# Patient Record
Sex: Male | Born: 1965 | Race: White | Hispanic: No | Marital: Single | Smoking: Current every day smoker
Health system: Southern US, Community
[De-identification: ages and names within clinical notes are randomized; demographics above are authoritative.]

## PROBLEM LIST (undated history)

## (undated) DIAGNOSIS — R42 Dizziness and giddiness: Secondary | ICD-10-CM

## (undated) DIAGNOSIS — R55 Syncope and collapse: Secondary | ICD-10-CM

## (undated) DIAGNOSIS — R0602 Shortness of breath: Secondary | ICD-10-CM

## (undated) HISTORY — DX: Shortness of breath: R06.02

## (undated) HISTORY — DX: Syncope and collapse: R55

## (undated) HISTORY — PX: HERNIA REPAIR: SHX51

## (undated) HISTORY — DX: Dizziness and giddiness: R42

## (undated) HISTORY — PX: KNEE SURGERY: SHX244

---

## 2018-03-28 ENCOUNTER — Emergency Department (HOSPITAL_COMMUNITY)
Admission: EM | Admit: 2018-03-28 | Discharge: 2018-03-28 | Disposition: A | Discharge status: Home / Self Care | Payer: Self-pay | Attending: Emergency Medicine | Admitting: Emergency Medicine

## 2018-03-28 ENCOUNTER — Other Ambulatory Visit: Payer: Self-pay

## 2018-03-28 ENCOUNTER — Encounter (HOSPITAL_COMMUNITY): Payer: Self-pay | Admitting: Emergency Medicine

## 2018-03-28 ENCOUNTER — Emergency Department (HOSPITAL_COMMUNITY): Payer: Self-pay

## 2018-03-28 DIAGNOSIS — R42 Dizziness and giddiness: Secondary | ICD-10-CM | POA: Insufficient documentation

## 2018-03-28 DIAGNOSIS — Z79899 Other long term (current) drug therapy: Secondary | ICD-10-CM | POA: Insufficient documentation

## 2018-03-28 LAB — BASIC METABOLIC PANEL
Anion gap: 11 (ref 5–15)
BUN: 14 mg/dL (ref 6–20)
CALCIUM: 9.9 mg/dL (ref 8.9–10.3)
CO2: 25 mmol/L (ref 22–32)
Chloride: 101 mmol/L (ref 98–111)
Creatinine, Ser: 0.83 mg/dL (ref 0.61–1.24)
GFR calc Af Amer: >60 mL/min (ref >60)
Glucose, Bld: 97 mg/dL (ref 70–99)
Potassium: 3.9 mmol/L (ref 3.5–5.1)
Sodium: 137 mmol/L (ref 135–145)

## 2018-03-28 LAB — CBC
HCT: 52.6 % — ABNORMAL HIGH (ref 39.0–52.0)
Hemoglobin: 17 g/dL (ref 13.0–17.0)
MCH: 31.8 pg (ref 26.0–34.0)
MCHC: 32.3 g/dL (ref 30.0–36.0)
MCV: 98.5 fL (ref 78.0–100.0)
Platelets: 138 10*3/uL — ABNORMAL LOW (ref 150–400)
RBC: 5.34 MIL/uL (ref 4.22–5.81)
RDW: 12.6 % (ref 11.5–15.5)
WBC: 8.5 10*3/uL (ref 4.0–10.5)

## 2018-03-28 LAB — I-STAT TROPONIN, ED: TROPONIN I, POC: 0 ng/mL (ref 0.00–0.08)

## 2018-03-28 MED ORDER — MECLIZINE HCL 25 MG PO TABS: 25.0000 mg | ORAL_TABLET | Freq: Three times a day (TID) | ORAL | 0 refills | Status: AC | PRN

## 2018-03-28 MED ORDER — MECLIZINE HCL 25 MG PO TABS
25.0000 mg | ORAL_TABLET | Freq: Once | ORAL | Status: AC
  Administered 2018-03-28: 25 mg via ORAL
  Filled 2018-03-28: qty 1

## 2018-03-28 NOTE — ED Provider Notes (Signed)
[facility] EMERGENCY DEPARTMENT Provider Note   CSN: [id number] Arrival date & time: 03/28/18  1506     History   Chief Complaint Chief Complaint  Patient presents with  . Dizziness    HPI David Bullock is a 52 y.o. male.  The history is provided by the patient.  Near Syncope  This is a chronic problem. The current episode started more than 1 week ago. The problem occurs every several days. The problem has not changed since onset.Pertinent negatives include no chest pain, no abdominal pain, no headaches and no shortness of breath. Exacerbated by: comes on every once in a while, no known trigger. Nothing relieves the symptoms. He has tried nothing for the symptoms. The treatment provided no relief.    History reviewed. No pertinent past medical history.  There are no active problems to display for this patient.   History reviewed. No pertinent surgical history.      Home Medications    Prior to Admission medications   Medication Sig Start Date End Date Taking? Authorizing Provider  acetaminophen (TYLENOL) 500 MG tablet Take 1,000 mg by mouth every 6 (six) hours as needed for mild pain, moderate pain, fever or headache.   Yes [provider]  meclizine (ANTIVERT) 25 MG tablet Take 1 tablet (25 mg total) by mouth 3 (three) times daily as needed for up to 20 doses for dizziness. 03/28/18   Virgina Norfolk, DO    Family History No family history on file.  Social History Social History   Tobacco Use  . Smoking status: Not on file  Substance Use Topics  . Alcohol use: Not on file  . Drug use: Not on file   Patient does not smoke, does not use alcohol, does not use drugs  Allergies   Aspirin and Ibuprofen   Review of Systems Review of Systems  Constitutional: Negative for chills and fever.  HENT: Negative for ear pain and sore throat.   Eyes: Negative for pain and visual disturbance.  Respiratory: Negative for cough and shortness  of breath.   Cardiovascular: Positive for near-syncope. Negative for chest pain and palpitations.  Gastrointestinal: Positive for nausea. Negative for abdominal pain and vomiting.  Genitourinary: Negative for dysuria and hematuria.  Musculoskeletal: Negative for arthralgias and back pain.  Skin: Negative for color change and rash.  Neurological: Positive for dizziness. Negative for seizures, syncope and headaches.  All other systems reviewed and are negative.    Physical Exam Updated Vital Signs  ED Triage Vitals  Enc Vitals Group     BP 03/28/18 1538 113/75     Pulse Rate 03/28/18 1538 74     Resp 03/28/18 1538 17     Temp 03/28/18 1538 98.5 F (36.9 C)     Temp src --      SpO2 03/28/18 1538 98 %     Weight 03/28/18 1541 187 lb (84.8 kg)     Height 03/28/18 1541 5\' 9"  (1.753 m)     Head Circumference --      Peak Flow --      Pain Score 03/28/18 1541 3     Pain Loc --      Pain Edu? --      Excl. in GC? --     Physical Exam  Constitutional: He is oriented to person, place, and time. He appears well-developed and well-nourished.  HENT:  Head: Normocephalic and atraumatic.  Mouth/Throat: No oropharyngeal exudate.  Eyes: Pupils are equal, round,  and reactive to light. Conjunctivae and EOM are normal.  Neck: Normal range of motion. Neck supple.  Cardiovascular: Normal rate, regular rhythm, normal heart sounds and intact distal pulses.  No murmur heard. Pulmonary/Chest: Effort normal and breath sounds normal. No stridor. No respiratory distress.  Abdominal: Soft. Bowel sounds are normal. There is no tenderness.  Musculoskeletal: Normal range of motion. He exhibits no edema.  Neurological: He is alert and oriented to person, place, and time. No cranial nerve deficit or sensory deficit. He exhibits normal muscle tone. Coordination normal.  5+/5 strength, normal sensation, no drift, normal finger to nose finger  Skin: Skin is warm and dry.  Psychiatric: He has a normal mood  and affect.  Nursing note and vitals reviewed.    ED Treatments / Results  Labs (all labs ordered are listed, but only abnormal results are displayed) Labs Reviewed  CBC - Abnormal; Notable for the following components:      Result Value   HCT 52.6 (*)    Platelets 138 (*)    All other components within normal limits  BASIC METABOLIC PANEL  I-STAT TROPONIN, ED    EKG EKG Interpretation  Date/Time:  Sunday March 28 2018 15:45:04 EDT Ventricular Rate:  69 PR Interval:  184 QRS Duration: 94 QT Interval:  380 QTC Calculation: 407 R Axis:   77 Text Interpretation:  Normal sinus rhythm with sinus arrhythmia Normal ECG Confirmed by Virgina Norfolk [phone]) on 03/28/2018 4:13:41 PM   Radiology Dg Chest 2 View  Result Date: 03/28/2018 CLINICAL DATA:  Patient with intermittent dizziness. EXAM: CHEST - 2 VIEW COMPARISON:  None. FINDINGS: Normal cardiac and mediastinal contours. No consolidative pulmonary opacities. No pleural effusion or pneumothorax. Regional skeleton is unremarkable. IMPRESSION: No active cardiopulmonary disease. Electronically Signed   By: Annia Belt M.D.   On: 03/28/2018 16:14   Ct Head Wo Contrast  Result Date: 03/28/2018 CLINICAL DATA:  Dizziness with nausea. EXAM: CT HEAD WITHOUT CONTRAST TECHNIQUE: Contiguous axial images were obtained from the base of the skull through the vertex without intravenous contrast. COMPARISON:  None. FINDINGS: Brain: No evidence of acute infarction, hemorrhage, hydrocephalus, extra-axial collection or mass lesion/mass effect. Vascular: No hyperdense vessel or unexpected calcification. Skull: Normal. Negative for fracture or focal lesion. Sinuses/Orbits: Orbits are normal. Paranasal sinuses are well developed and well aerated with mild opacification over the ethmoid sinus and frontal ethmoidal recesses. Mastoid air cells are clear. Other: None. IMPRESSION: No acute findings. Mild chronic sinus inflammatory change. Electronically Signed    By: Elberta Fortis M.D.   On: 03/28/2018 18:38    Procedures Procedures (including critical care time)  Medications Ordered in ED Medications  meclizine (ANTIVERT) tablet 25 mg (25 mg Oral Given 03/28/18 1834)     Initial Impression / Assessment and Plan / ED Course  I have reviewed the triage vital signs and the nursing notes.  Pertinent labs & imaging results that were available during my care of the patient were reviewed by me and considered in my medical decision making (see chart for details).     David Bullock is a 52 year old male with no significant medical history who presents to the ED with dizziness.  Patient with unremarkable vitals.  No fever.  EKG upon arrival overall unremarkable.  Sinus arrhythmia.  Patient denies any chest pain, no shortness of breath.  Has been struggling with intermittent dizziness over the last several months.  Is supposed to follow-up with cardiology this week.  Patient denies any loss of  consciousness.  He states that symptoms occur randomly.  He states that he gets some feeling as if he is going to pass out and some nausea during the events that self resolved after several seconds.  Patient denies any abdominal pain.  Patient with normal neurological exam.  He denies any ear pain, ringing in his ears.  Denies any cough, fever, sputum production.  History and physical is not consistent with a central process.  No concern for stroke.  Head CT performed showed no acute findings.  No mass-effect.  Patient with no significant electrolyte abnormalities, kidney injury, leukocytosis.  Opponent within normal limits.  Patient with no chest pain and doubt ACS.  Repeat EKG shows sinus rhythm.  No sign of heart block.  No obvious irregularity in EKG.  Patient was given meclizine.  Possibly patient with intermittent arrhythmia possibly some symptomatic bradycardia as his heart rate is in the 60s.  He does have follow-up with cardiology this week and would likely  benefit from a Holter monitor.  Patient given meclizine prescription for possible vertigo and given information to follow-up with ENT.  Recommend follow-up with primary care doctor as may benefit from an MRI of his brain to look for other sources of intermittent dizziness.  However patient hemodynamically stable throughout my care and discharge.  Given return precautions.  This chart was dictated using voice recognition software.  Despite best efforts to proofread,  errors can occur which can change the documentation meaning.   Final Clinical Impressions(s) / ED Diagnoses   Final diagnoses:  Dizziness    ED Discharge Orders         Ordered    meclizine (ANTIVERT) 25 MG tablet  3 times daily PRN     03/28/18 1933           Virgina NorfolkCuratolo, Roselani Grajeda, DO 03/28/18 2208

## 2018-03-28 NOTE — ED Notes (Signed)
Pt taken to imaging from triage area in Pod A. Will arrive to Pod E after.

## 2018-03-28 NOTE — Discharge Instructions (Addendum)
Follow up with cardiology as scheduled. °

## 2018-03-28 NOTE — ED Triage Notes (Signed)
Pt reports intermittent dizziness for months that comes and goes, he gets dizzy, pale, and feels like he is going to pass out when this happens. He was taken out of work until he can be cleared by a doctor, and he has a follow up with cardiology this Wednesday with [facility]. [facility] was [facility] at [facility] and had an EKG and labs on Sept 3. Pt reports today he has bene having "chest pain/heart burn", states he has acid reflux but today he has had it all day.

## 2018-03-28 NOTE — ED Notes (Signed)
Patient transported to CT 

## 2018-03-28 NOTE — ED Notes (Signed)
ED Provider at bedside. 

## 2018-03-28 NOTE — ED Notes (Signed)
Pt in triage getting blood drawn, to be taken to xray and then roomed to E40, imaging and RN aware of plan

## 2018-03-28 NOTE — ED Notes (Signed)
Patient verbalizes understanding of discharge instructions. Opportunity for questioning and answers were provided. Armband removed by staff, pt discharged from ED ambulatory.   

## 2018-03-30 ENCOUNTER — Telehealth: Payer: Self-pay

## 2018-03-30 DIAGNOSIS — R42 Dizziness and giddiness: Secondary | ICD-10-CM

## 2018-03-30 DIAGNOSIS — R55 Syncope and collapse: Secondary | ICD-10-CM

## 2018-03-30 HISTORY — DX: Syncope and collapse: R55

## 2018-03-30 HISTORY — DX: Dizziness and giddiness: R42

## 2018-03-30 NOTE — Telephone Encounter (Signed)
NOTES FAXED TO [facility] °

## 2018-03-30 NOTE — Progress Notes (Signed)
Cardiology Office Note:    Date:  03/31/2018   ID:  David Bullock, DOB August 28, 1965, MRN [id number]  PCP:  Jerrell BelfastJackson, Chelsea, FNP  Cardiologist:  Norman HerrlichBrian Rahsaan Weakland, MD   Referring MD: No ref. provider found  ASSESSMENT:    1. Dizziness   2. Near syncope   3. Shortness of breath    PLAN:    In order of problems listed above:  1. His symptoms are quite vague not specific for arrhythmia and I would not classify his near syncope.  For quick evaluation we will place a 14-day extended heart monitor and do a myocardial perfusion study to answer the question whether he has underlying heart disease I suspect these will be reassuring.  I have scheduled him to be seen by ENT and referred any questions of disability return to work. 2. See above 3. Clinical concern of anginal equivalent myocardial perfusion study ordered.  Differential diagnosis includes symptomatic COPD from greater than 30-pack-year cigarette smoking he was counseled to stop.  Next appointment 4 weeks   Medication Adjustments/Labs and Tests Ordered: Current medicines are reviewed at length with the patient today.  Concerns regarding medicines are outlined above.  Orders Placed This Encounter  Procedures  . Ambulatory referral to ENT  . LONG TERM MONITOR (3-14 DAYS)  . MYOCARDIAL PERFUSION IMAGING   No orders of the defined types were placed in this encounter.    Chief Complaint  Patient presents with  . Follow-up    dizzy    History of Present Illness:    David ShoalsWilliam Bullock is a 52 y.o. male who is being seen today for the evaluation of dizziness and what was called near syncope at the request of [facility] ED.  [facility]28/2018 with a several month history of intermittent dizziness. EKG was normal with sinus arrhythmia physiologic laboratory studies are normal CT of the head was normal and a notation is made that he had a t referral to cardiology for evaluation of arrhythmia.   Known history of heart disease no arrhythmia while monitored in the ED department Was [facility] 03/16/2018 with similar complaints was diagnosed with near syncope.  He has had a recent onset of episodes of lightheadedness and 2 ED visits.  On the first 20 was put out of work by the emergency room staff at [facility].  The episodes are not vertigo they are not positional intermittently feels lightheaded he has had no syncope or near syncope he has had no previous anterior problems or tinnitus and unfortunately does not have a primary care physician has not been seen by ENT.  He is concerned that he may have a heart problem is his wife has a permanent pacemaker for sick sinus syndrome at age 52 and a brother sounds like he had congenital complete heart block with pacemaker as a teenager.  He has a history of cigarette smoking for 35 years 1 pack/day and notices himself short of breath when he walks uphill walks a longer distance no chest pain orthopnea edema palpitation or syncope and again he is quite concerned he is unrecognized heart disease.  He has no cough or wheezing.  His CT scan was done in the ED he has no history of seizure disorder or head trauma Past Medical History:  Diagnosis Date  . Dizziness 03/30/2018  . Near syncope 03/30/2018  . Shortness of breath     Past Surgical History:  Procedure  Laterality Date  . HERNIA REPAIR    . KNEE SURGERY Right     Current Medications: Current Meds  Medication Sig  . acetaminophen (TYLENOL) 500 MG tablet Take 1,000 mg by mouth every 6 (six) hours as needed for mild pain, moderate pain, fever or headache.  . meclizine (ANTIVERT) 25 MG tablet Take 1 tablet (25 mg total) by mouth 3 (three) times daily as needed for up to 20 doses for dizziness.     Allergies:   Aspirin and Ibuprofen   Social History   Socioeconomic History  . Marital status: Single    Spouse name: Not on file  . Number of children: Not on file  . Years of  education: Not on file  . Highest education level: Not on file  Occupational History  . Not on file  Social Needs  . Financial resource strain: Not on file  . Food insecurity:    Worry: Not on file    Inability: Not on file  . Transportation needs:    Medical: Not on file    Non-medical: Not on file  Tobacco Use  . Smoking status: Current Every Day Smoker    Packs/day: 1.00    Years: 35.00    Pack years: 35.00    Types: Cigarettes  . Smokeless tobacco: Never Used  Substance and Sexual Activity  . Alcohol use: Yes    Comment: rarely  . Drug use: Not Currently  . Sexual activity: Not on file  Lifestyle  . Physical activity:    Days per week: Not on file    Minutes per session: Not on file  . Stress: Not on file  Relationships  . Social connections:    Talks on phone: Not on file    Gets together: Not on file    Attends religious service: Not on file    Active member of club or organization: Not on file    Attends meetings of clubs or organizations: Not on file    Relationship status: Not on file  Other Topics Concern  . Not on file  Social History Narrative  . Not on file     Family History: The patient's family history includes Arrhythmia in his brother; CAD in his father; Dementia in his mother.  ROS:   Review of Systems  Constitution: Positive for chills.  HENT: Negative.   Eyes: Negative.   Cardiovascular: Positive for dyspnea on exertion. Negative for palpitations and syncope.  Respiratory: Positive for shortness of breath.   Endocrine: Negative.   Hematologic/Lymphatic: Negative.   Skin: Negative.   Musculoskeletal: Negative.   Gastrointestinal: Negative.   Genitourinary: Negative.   Neurological: Positive for dizziness. Negative for vertigo.  Psychiatric/Behavioral: Negative.   Allergic/Immunologic: Negative.    Please see the history of present illness.     All other systems reviewed and are negative.  EKGs/Labs/Other Studies Reviewed:    The  following studies were reviewed today: To ED records reviewed prior to visit  I did not repeat an EKG today  Recent Labs: 03/28/2018: BUN 14; Creatinine, Ser 0.83; Hemoglobin 17.0; Platelets 138; Potassium 3.9; Sodium 137  Recent Lipid Panel No results found for: CHOL, TRIG, HDL, CHOLHDL, VLDL, LDLCALC, LDLDIRECT  Physical Exam:    VS:  BP 120/82 (BP Location: Left Arm, Patient Position: Sitting, Cuff Size: Normal)   Pulse 76   Ht 5\' 9"  (1.753 m)   Wt 190 lb 12.8 oz (86.5 kg)   SpO2 95%   BMI 28.18 kg/m  Wt Readings from Last 3 Encounters:  03/31/18 190 lb 12.8 oz (86.5 kg)  03/28/18 187 lb (84.8 kg)     GEN:  Well nourished, well developed in no acute distress HEENT: Normal NECK: No JVD; No carotid bruits LYMPHATICS: No lymphadenopathy CARDIAC: RRR, no murmurs, rubs, gallops RESPIRATORY:  Clear to auscultation without rales, wheezing or rhonchi  ABDOMEN: Soft, non-tender, non-distended MUSCULOSKELETAL:  No edema; No deformity  SKIN: Warm and dry NEUROLOGIC:  Alert and oriented x 3 PSYCHIATRIC:  Normal affect     Signed, Norman Herrlich, MD  03/31/2018 12:28 PM     [facility]

## 2018-03-31 ENCOUNTER — Telehealth (HOSPITAL_COMMUNITY): Payer: Self-pay

## 2018-03-31 ENCOUNTER — Ambulatory Visit (INDEPENDENT_AMBULATORY_CARE_PROVIDER_SITE_OTHER): Payer: Self-pay | Admitting: Cardiology

## 2018-03-31 ENCOUNTER — Encounter: Payer: Self-pay | Admitting: Cardiology

## 2018-03-31 VITALS — BP 120/82 | HR 76 | Ht 69.0 in | Wt 190.8 lb

## 2018-03-31 DIAGNOSIS — R42 Dizziness and giddiness: Secondary | ICD-10-CM

## 2018-03-31 DIAGNOSIS — R55 Syncope and collapse: Secondary | ICD-10-CM

## 2018-03-31 DIAGNOSIS — R0602 Shortness of breath: Secondary | ICD-10-CM

## 2018-03-31 NOTE — Telephone Encounter (Signed)
Pt contacted with instructions for his Stress Myoview Nuclear study on 04/01/2018 at 7:30 am. Detailed instructions left on his answering machine. Advised to call us back with any questions. S.Taje Littler EMTP

## 2018-03-31 NOTE — Patient Instructions (Addendum)
Medication Instructions:  Your physician recommends that you continue on your current medications as directed. Please refer to the Current Medication list given to you today.   Labwork: NONE  Testing/Procedures: Your physician has requested that you have en exercise stress myoview. For further information please visit [email or web address]. Please follow instruction sheet, as given.  Your physician has recommended that you wear a 14 day ZIO patch.  A ZIO patch is a medical device that record the heart's electrical activity. Doctors most often use these monitors to diagnose arrhythmias. Arrhythmias are problems with the speed or rhythm of the heartbeat. The monitor is a small, portable device. You can wear one while you do your normal daily activities. This is usually used to diagnose what is causing palpitations/syncope (passing out).   Follow-Up: Your physician recommends that you schedule a follow-up appointment in: 4 weeks.   Any Other Special Instructions Will Be Listed Below (If Applicable).     If you need a refill on your cardiac medications before your next appointment, please call your pharmacy.

## 2018-04-01 ENCOUNTER — Ambulatory Visit (HOSPITAL_COMMUNITY): Payer: Self-pay | Attending: Cardiovascular Disease

## 2018-04-01 DIAGNOSIS — R0602 Shortness of breath: Secondary | ICD-10-CM | POA: Insufficient documentation

## 2018-04-01 DIAGNOSIS — R42 Dizziness and giddiness: Secondary | ICD-10-CM

## 2018-04-01 DIAGNOSIS — R55 Syncope and collapse: Secondary | ICD-10-CM | POA: Insufficient documentation

## 2018-04-01 LAB — MYOCARDIAL PERFUSION IMAGING
CHL CUP RESTING HR STRESS: 67 {beats}/min
CHL RATE OF PERCEIVED EXERTION: 17
CSEPED: 8 min
CSEPEW: 10.1 METS
CSEPHR: 89 %
CSEPPHR: 151 {beats}/min
LV dias vol: 99 mL (ref 62–150)
LV sys vol: 42 mL
MPHR: 169 {beats}/min
NUC STRESS TID: 1.03
SDS: 0
SRS: 0
SSS: 0

## 2018-04-01 MED ORDER — TECHNETIUM TC 99M TETROFOSMIN IV KIT
10.4000 | PACK | Freq: Once | INTRAVENOUS | Status: AC | PRN
  Administered 2018-04-01: 10.4 via INTRAVENOUS
  Filled 2018-04-01: qty 11

## 2018-04-01 MED ORDER — TECHNETIUM TC 99M TETROFOSMIN IV KIT
32.4000 | PACK | Freq: Once | INTRAVENOUS | Status: AC | PRN
  Administered 2018-04-01: 32.4 via INTRAVENOUS
  Filled 2018-04-01: qty 33

## 2018-04-05 ENCOUNTER — Telehealth: Payer: Self-pay | Admitting: *Deleted

## 2018-04-05 ENCOUNTER — Ambulatory Visit: Payer: Self-pay

## 2018-04-05 DIAGNOSIS — R42 Dizziness and giddiness: Secondary | ICD-10-CM

## 2018-04-05 NOTE — Telephone Encounter (Signed)
Pt has disability paper work that he needs filled out to go back to work. I faxed the papers to [place] for Dr. Dulce SellarMunley and sent Santina Evansatherine a staff msg.

## 2018-04-06 ENCOUNTER — Encounter: Payer: Self-pay | Admitting: *Deleted

## 2018-04-19 ENCOUNTER — Ambulatory Visit: Payer: Self-pay | Admitting: Cardiology

## 2018-04-29 NOTE — Progress Notes (Signed)
Cardiology Office Note:    Date:  04/30/2018   ID:  David Bullock, DOB 12-29-1965, MRN [id number]  PCP:  Jerrell Belfast, FNP  Cardiologist:  Norman Herrlich, MD    Referring MD: Jerrell Belfast, FNP    ASSESSMENT:    1. Dizziness   2. Near syncope    PLAN:    In order of problems listed above:  1. Ongoing symptoms typical positional vertigo and has an appointment to be seen by ENT 2. His testing has been reassuring he has no evidence of arrhythmia and he has no evidence of underlying structural heart disease at this time I perform no further cardiac testing   Next appointment: As needed   Medication Adjustments/Labs and Tests Ordered: Current medicines are reviewed at length with the patient today.  Concerns regarding medicines are outlined above.  No orders of the defined types were placed in this encounter.  No orders of the defined types were placed in this encounter.   Chief Complaint  Patient presents with  . Follow-up    after cardiac tests  . Dizziness  . Shortness of Breath    History of Present Illness:    David Bullock is a 52 y.o. male with a hx of dizziness last seen 03/31/18. His Zio 14 day monitor and stress MPI were both normal. 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  At that time I felt that in all likelihood of shortness of breath was due to symptomatic COPD with greater than 30-year cigarette smoking history he was counseled regarding smoking cessation.  He is improved but is still having mild symptoms of positional dizziness.  He is scheduled to be seen by ENT shortness of breath is unchanged she is committed to smoking cessation has a plan to withdraw down to half a pack per day and then use over-the-counter nicotine substitutes.  He has had no chest pain  or syncope or palpitation Compliance with diet, lifestyle and medications: yes Past Medical History:  Diagnosis Date  . Dizziness 03/30/2018  . Near syncope 03/30/2018  . Shortness of breath     Past Surgical History:  Procedure Laterality Date  . HERNIA REPAIR    . KNEE SURGERY Right     Current Medications: Current Meds  Medication Sig  . acetaminophen (TYLENOL) 500 MG tablet Take 1,000 mg by mouth every 6 (six) hours as needed for mild pain, moderate pain, fever or headache.  . meclizine (ANTIVERT) 25 MG tablet Take 1 tablet (25 mg total) by mouth 3 (three) times daily as needed for up to 20 doses for dizziness.     Allergies:   Aspirin and Ibuprofen   Social History   Socioeconomic History  . Marital status: Single    Spouse name: Not on file  . Number of children: Not on file  . Years of education: Not on file  . Highest education level: Not on file  Occupational History  . Not on file  Social Needs  . Financial resource strain: Not on file  . Food insecurity:    Worry: Not on file    Inability: Not on file  . Transportation needs:    Medical: Not on file    Non-medical: Not on file  Tobacco Use  . Smoking status: Current Every Day Smoker    Packs/day: 1.00  Years: 35.00    Pack years: 35.00    Types: Cigarettes  . Smokeless tobacco: Never Used  Substance and Sexual Activity  . Alcohol use: Yes    Comment: rarely  . Drug use: Not Currently  . Sexual activity: Not on file  Lifestyle  . Physical activity:    Days per week: Not on file    Minutes per session: Not on file  . Stress: Not on file  Relationships  . Social connections:    Talks on phone: Not on file    Gets together: Not on file    Attends religious service: Not on file    Active member of club or organization: Not on file    Attends meetings of clubs or organizations: Not on file    Relationship status: Not on file  Other Topics Concern  . Not on file  Social History Narrative  .  Not on file     Family History: The patient's family history includes Arrhythmia in his brother; CAD in his father; Dementia in his mother. ROS:   Please see the history of present illness.    All other systems reviewed and are negative.  EKGs/Labs/Other Studies Reviewed:    The following studies were reviewed today:  EKG:  EKG 03/28/18 [facility] sinus arrhyhtmia normal EKG  Recent Labs: 03/28/2018: BUN 14; Creatinine, Ser 0.83; Hemoglobin 17.0; Platelets 138; Potassium 3.9; Sodium 137  Recent Lipid Panel No results found for: CHOL, TRIG, HDL, CHOLHDL, VLDL, LDLCALC, LDLDIRECT  Physical Exam:    VS:  BP 106/76 (BP Location: Right Arm, Patient Position: Sitting, Cuff Size: Normal)   Pulse 68   Ht 5\' 9"  (1.753 m)   Wt 190 lb 1.9 oz (86.2 kg)   SpO2 98%   BMI 28.08 kg/m     Wt Readings from Last 3 Encounters:  04/30/18 190 lb 1.9 oz (86.2 kg)  03/31/18 190 lb 12.8 oz (86.5 kg)  03/28/18 187 lb (84.8 kg)     GEN:  Well nourished, well developed in no acute distress HEENT: Normal NECK: No JVD; No carotid bruits LYMPHATICS: No lymphadenopathy CARDIAC: RRR, no murmurs, rubs, gallops RESPIRATORY:  Clear to auscultation without rales, wheezing or rhonchi  ABDOMEN: Soft, non-tender, non-distended MUSCULOSKELETAL:  No edema; No deformity  SKIN: Warm and dry NEUROLOGIC:  Alert and oriented x 3 PSYCHIATRIC:  Normal affect    Signed, Norman Herrlich, MD  04/30/2018 8:54 AM    [facility]

## 2018-04-30 ENCOUNTER — Encounter: Payer: Self-pay | Admitting: Cardiology

## 2018-04-30 ENCOUNTER — Ambulatory Visit (INDEPENDENT_AMBULATORY_CARE_PROVIDER_SITE_OTHER): Payer: Self-pay | Admitting: Cardiology

## 2018-04-30 VITALS — BP 106/76 | HR 68 | Ht 69.0 in | Wt 190.1 lb

## 2018-04-30 DIAGNOSIS — R42 Dizziness and giddiness: Secondary | ICD-10-CM

## 2018-04-30 DIAGNOSIS — R55 Syncope and collapse: Secondary | ICD-10-CM

## 2018-04-30 NOTE — Patient Instructions (Addendum)
Medication Instructions:  Your physician recommends that you continue on your current medications as directed. Please refer to the Current Medication list given to you today.  If you need a refill on your cardiac medications before your next appointment, please call your pharmacy.   Lab work: None  Testing/Procedures: None  Follow-Up: At [facility], you and your health needs are our priority.  As part of our continuing mission to provide you with exceptional heart care, we have created designated Provider Care Teams.  These Care Teams include your primary Cardiologist (physician) and Advanced Practice Providers (APPs -  Physician Assistants and Nurse Practitioners) who all work together to provide you with the care you need, when you need it. You will need a follow up appointment as needed if symptoms worsen or fail to improve.     Tobacco Use Disorder Tobacco use disorder (TUD) is a mental disorder. It is the long-term use of tobacco in spite of related health problems or difficulty with normal life activities. Tobacco is most commonly smoked as cigarettes and less commonly as cigars or pipes. Smokeless chewing tobacco and snuff are also popular. People with TUD get a feeling of extreme pleasure (euphoria) from using tobacco and have a desire to use it again and again. Repeated use of tobacco can cause problems. The addictive effects of tobacco are due mainly tothe ingredient nicotine. Nicotine also causes a rush of adrenaline (epinephrine) in the body. This leads to increased blood pressure, heart rate, and breathing rate. These changes may cause problems for people with high blood pressure, weak hearts, or lung disease. High doses of nicotine in children and pets can lead to seizures and death. Tobacco contains a number of other unsafe chemicals. These chemicals are especially harmful when inhaled as smoke and can damage almost every organ in the body. Smokers live shorter lives than  nonsmokers and are at risk of dying from a number of diseases and cancers. Tobacco smoke can also cause health problems for nonsmokers (due to inhaling secondhand smoke). Smoking is also a fire hazard. TUD usually starts in the late teenage years and is most common in young adults between the ages of 80 and 25 years. People who start smoking earlier in life are more likely to continue smoking as adults. TUD is somewhat more common in men than women. People with TUD are at higher risk for using alcohol and other drugs of abuse. What increases the risk? Risk factors for TUD include:  Having family members with the disorder.  Being around people who use tobacco.  Having an existing mental health issue such as schizophrenia, depression, bipolar disorder, ADHD, or posttraumatic stress disorder (PTSD).  What are the signs or symptoms? People with tobacco use disorder have two or more of the following signs and symptoms within 12 months:  Use of more tobacco over a longer period than intended.  Not able to cut down or control tobacco use.  A lot of time spent obtaining or using tobacco.  Strong desire or urge to use tobacco (craving). Cravings may last for 6 months or longer after quitting.  Use of tobacco even when use leads to major problems at work, school, or home.  Use of tobacco even when use leads to relationship problems.  Giving up or cutting down on important life activities because of tobacco use.  Repeatedly using tobacco in situations where it puts you or others in physical danger, like smoking in bed.  Use of tobacco even when it  is known that a physical or mental problem is likely related to tobacco use. ? Physical problems are numerous and may include chronic bronchitis, emphysema, lung and other cancers, gum disease, high blood pressure, heart disease, and stroke. ? Mental problems caused by tobacco may include difficulty sleeping and anxiety.  Need to use greater amounts  of tobacco to get the same effect. This means you have developed a tolerance.  Withdrawal symptoms as a result of stopping or rapidly cutting back use. These symptoms may last a month or more after quitting and include the following: ? Depressed, anxious, or irritable mood. ? Difficulty concentrating. ? Increased appetite. ? Restlessness or trouble sleeping. ? Use of tobacco to avoid withdrawal symptoms.  How is this diagnosed? Tobacco use disorder is diagnosed by your health care provider. A diagnosis may be made by:  Your health care provider asking questions about your tobacco use and any problems it may be causing.  A physical exam.  Lab tests.  You may be referred to a mental health professional or addiction specialist.  The severity of tobacco use disorder depends on the number of signs and symptoms you have:  Mild-Two or three symptoms.  Moderate-Four or five symptoms.  Severe-Six or more symptoms.  How is this treated? Many people with tobacco use disorder are unable to quit on their own and need help. Treatment options include the following:  Nicotine replacement therapy (NRT). NRT provides nicotine without the other harmful chemicals in tobacco. NRT gradually lowers the dosage of nicotine in the body and reduces withdrawal symptoms. NRT is available in over-the-counter forms (gum, lozenges, and skin patches) as well as prescription forms (mouth inhaler and nasal spray).  Medicines.This may include: ? Antidepressant medicine that may reduce nicotine cravings. ? A medicine that acts on nicotine receptors in the brain to reduce cravings and withdrawal symptoms. It may also block the effects of tobacco in people with TUD who relapse.  Counseling or talk therapy. A form of talk therapy called behavioral therapy is commonly used to treat people with TUD. Behavioral therapy looks at triggers for tobacco use, how to avoid them, and how to cope with cravings. It is most  effective in person or by phone but is also available in self-help forms (books and Internet websites).  Support groups. These provide emotional support, advice, and guidance for quitting tobacco.  The most effective treatment for TUD is usually a combination of medicine, talk therapy, and support groups. Follow these instructions at home:  Keep all follow-up visits as directed by your health care provider. This is important.  Take medicines only as directed by your health care provider.  Check with your health care provider before starting new prescription or over-the-counter medicines. Contact a health care provider if:  You are not able to take your medicines as prescribed.  Treatment is not helping your TUD and your symptoms get worse. Get help right away if:  You have serious thoughts about hurting yourself or others.  You have trouble breathing, chest pain, sudden weakness, or sudden numbness in part of your body. This information is not intended to replace advice given to you by your health care provider. Make sure you discuss any questions you have with your health care provider. Document Released: 03/05/2004 Document Revised: 03/02/2016 Document Reviewed: 08/26/2013 Elsevier Interactive Patient Education  2018 [employer].  American Heart Association for smoking cessation support

## 2018-08-05 ENCOUNTER — Ambulatory Visit (INDEPENDENT_AMBULATORY_CARE_PROVIDER_SITE_OTHER): Payer: Self-pay | Admitting: Otolaryngology

## 2020-10-24 ENCOUNTER — Encounter (HOSPITAL_COMMUNITY): Payer: Self-pay

## 2020-10-24 ENCOUNTER — Emergency Department (HOSPITAL_COMMUNITY)
Admission: EM | Admit: 2020-10-24 | Discharge: 2020-10-24 | Disposition: A | Discharge status: Home / Self Care | Payer: Self-pay | Attending: Emergency Medicine | Admitting: Emergency Medicine

## 2020-10-24 ENCOUNTER — Emergency Department (HOSPITAL_COMMUNITY): Payer: Self-pay

## 2020-10-24 DIAGNOSIS — S39012A Strain of muscle, fascia and tendon of lower back, initial encounter: Secondary | ICD-10-CM

## 2020-10-24 DIAGNOSIS — M545 Low back pain, unspecified: Secondary | ICD-10-CM | POA: Insufficient documentation

## 2020-10-24 DIAGNOSIS — F17210 Nicotine dependence, cigarettes, uncomplicated: Secondary | ICD-10-CM | POA: Insufficient documentation

## 2020-10-24 MED ORDER — ACETAMINOPHEN-CODEINE #3 300-30 MG PO TABS: 1.0000 | ORAL_TABLET | Freq: Four times a day (QID) | ORAL | 0 refills | Status: AC | PRN

## 2020-10-24 MED ORDER — ACETAMINOPHEN-CODEINE #3 300-30 MG PO TABS: 1.0000 | ORAL_TABLET | Freq: Four times a day (QID) | ORAL | 0 refills | Status: DC | PRN

## 2020-10-24 MED ORDER — METHOCARBAMOL 500 MG PO TABS: 500.0000 mg | ORAL_TABLET | Freq: Three times a day (TID) | ORAL | 0 refills | Status: AC | PRN

## 2020-10-24 MED ORDER — METHOCARBAMOL 500 MG PO TABS: 500.0000 mg | ORAL_TABLET | Freq: Three times a day (TID) | ORAL | 0 refills | Status: DC | PRN

## 2020-10-24 MED ORDER — ACETAMINOPHEN 325 MG PO TABS
650.0000 mg | ORAL_TABLET | Freq: Once | ORAL | Status: AC
  Administered 2020-10-24: 650 mg via ORAL
  Filled 2020-10-24: qty 2

## 2020-10-24 MED ORDER — METHOCARBAMOL 500 MG PO TABS
1000.0000 mg | ORAL_TABLET | Freq: Once | ORAL | Status: AC
  Administered 2020-10-24: 1000 mg via ORAL
  Filled 2020-10-24: qty 2

## 2020-10-24 MED ORDER — MORPHINE SULFATE (PF) 4 MG/ML IV SOLN
10.0000 mg | Freq: Once | INTRAVENOUS | Status: AC
  Administered 2020-10-24: 10 mg via INTRAMUSCULAR
  Filled 2020-10-24: qty 3

## 2020-10-24 NOTE — ED Provider Notes (Signed)
[facility] EMERGENCY DEPARTMENT Provider Note   CSN: [id number] Arrival date & time: 10/24/20  1032     History Chief Complaint  Patient presents with  . Back Pain    David Bullock is a 55 y.o. male.   Back Pain Location:  Lumbar spine Quality:  Aching and stiffness Stiffness is present:  All day Radiates to:  Does not radiate Pain severity:  Severe Pain is:  Worse during the day Onset quality:  Sudden Duration:  3 days Timing:  Constant Progression:  Unchanged Chronicity:  Recurrent Context: twisting   Relieved by:  Cold packs Worsened by:  Ambulation, bending, heat, standing, twisting, touching and movement Ineffective treatments:  Heating pad Associated symptoms: no abdominal pain, no abdominal swelling, no bladder incontinence, no bowel incontinence, no dysuria, no fever, no headaches, no leg pain, no numbness, no paresthesias, no pelvic pain, no perianal numbness, no tingling and no weakness   Risk factors comment:  Previous back injury, smoking      Past Medical History:  Diagnosis Date  . Dizziness 03/30/2018  . Near syncope 03/30/2018  . Shortness of breath     Patient Active Problem List   Diagnosis Date Noted  . Near syncope 03/30/2018  . Dizziness 03/30/2018    Past Surgical History:  Procedure Laterality Date  . HERNIA REPAIR    . KNEE SURGERY Right        Family History  Problem Relation Age of Onset  . Dementia Mother   . CAD Father   . Arrhythmia Brother     Social History   Tobacco Use  . Smoking status: Current Every Day Smoker    Packs/day: 1.00    Years: 35.00    Pack years: 35.00    Types: Cigarettes  . Smokeless tobacco: Never Used  Vaping Use  . Vaping Use: Never used  Substance Use Topics  . Alcohol use: Yes    Comment: rarely  . Drug use: Not Currently    Home Medications Prior to Admission medications   Medication Sig Start Date End Date Taking? Authorizing Provider   acetaminophen-codeine (TYLENOL #3) 300-30 MG tablet Take 1-2 tablets by mouth every 6 (six) hours as needed for moderate pain. 10/24/20  Yes Leshaun Biebel, PA-C  methocarbamol (ROBAXIN) 500 MG tablet Take 1 tablet (500 mg total) by mouth 3 (three) times daily as needed for muscle spasms. 10/24/20  Yes Ellon Marasco, PA-C  acetaminophen (TYLENOL) 500 MG tablet Take 1,000 mg by mouth every 6 (six) hours as needed for mild pain, moderate pain, fever or headache.    [provider]  meclizine (ANTIVERT) 25 MG tablet Take 1 tablet (25 mg total) by mouth 3 (three) times daily as needed for up to 20 doses for dizziness. 03/28/18   Curatolo, Adam, DO    Allergies    Aspirin and Ibuprofen  Review of Systems   Review of Systems  Constitutional: Negative for fever.  Gastrointestinal: Negative for abdominal pain and bowel incontinence.  Genitourinary: Negative for bladder incontinence, dysuria and pelvic pain.  Musculoskeletal: Positive for back pain.  Neurological: Negative for tingling, weakness, numbness, headaches and paresthesias.    Physical Exam Updated Vital Signs BP 116/87 (BP Location: Right Arm)   Pulse 84   Temp 98.3 F (36.8 C) (Oral)   Resp 18   SpO2 96%   Physical Exam Vitals and nursing note reviewed.  Constitutional:      General: He is not in acute distress.  Appearance: He is well-developed. He is not diaphoretic.  HENT:     Head: Normocephalic and atraumatic.  Eyes:     General: No scleral icterus.    Conjunctiva/sclera: Conjunctivae normal.  Cardiovascular:     Rate and Rhythm: Normal rate and regular rhythm.     Heart sounds: Normal heart sounds.  Pulmonary:     Effort: Pulmonary effort is normal. No respiratory distress.     Breath sounds: Normal breath sounds.  Abdominal:     Palpations: Abdomen is soft.     Tenderness: There is no abdominal tenderness. There is no right CVA tenderness or left CVA tenderness.  Musculoskeletal:     Cervical  back: Normal range of motion and neck supple.     Lumbar back: Spasms and tenderness present. No swelling or bony tenderness. Decreased range of motion. Negative right straight leg raise test and negative left straight leg raise test.       Back:     Comments: No midline tenderness + palpable spasm R lumbar paraspinals ROM limited by pain - SR test BL Normal sensation and dtrs  Skin:    General: Skin is warm and dry.  Neurological:     Mental Status: He is alert.  Psychiatric:        Behavior: Behavior normal.     ED Results / Procedures / Treatments   Labs (all labs ordered are listed, but only abnormal results are displayed) Labs Reviewed - No data to display  EKG None  Radiology DG Lumbar Spine Complete  Result Date: 10/24/2020 CLINICAL DATA:  Back pain EXAM: LUMBAR SPINE - COMPLETE 4+ VIEW COMPARISON:  None. FINDINGS: Early disc space narrowing at L4-5 and L5-S1. Normal alignment. No fracture. SI joints symmetric and unremarkable. 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 No acute bony abnormality. Electronically Signed   By: Charlett Nose M.D.   On: 10/24/2020 11:14    Procedures Procedures   Medications Ordered in ED Medications  methocarbamol (ROBAXIN) tablet 1,000 mg (has no administration in time range)  morphine 4 MG/ML injection 10 mg (has no administration in time range)  acetaminophen (TYLENOL) tablet 650 mg (has no administration in time range)    ED Course  I have reviewed the triage vital signs and the nursing notes.  Pertinent labs & imaging results that were available during my care of the patient were reviewed by me and considered in my medical decision making (see chart for details).    MDM Rules/Calculators/A&P                          Patient with back pain. I ordered, interpreted and reviewed and lumbar plain film that shows mild arthritic changes without other acute abnormality.   No neurological deficits and  normal neuro exam.  Patient can walk but states is painful.  No loss of bowel or bladder control.  No concern for cauda equina.  No fever, night sweats, weight loss, h/o cancer, IVDU.  RICE protocol and pain medicine indicated and discussed with patient.   PDMP reviewed during this encounter.  Final Clinical Impression(s) / ED Diagnoses Final diagnoses:  None    Rx / DC Orders ED Discharge Orders         Ordered    methocarbamol (ROBAXIN) 500 MG tablet  3 times daily PRN        10/24/20 1134    acetaminophen-codeine (TYLENOL #3) 300-30 MG tablet  Every 6 hours PRN        10/24/20 1134           Arthor Captain, PA-C 10/24/20 1136    Benjiman Core, MD 10/24/20 [phone]

## 2020-10-24 NOTE — ED Notes (Signed)
2mg  Morphine wasted in sharps with Cortney, RN

## 2020-10-24 NOTE — ED Triage Notes (Signed)
Pt reports on Sunday he was getting out the shower and had started having back pain.Pt reports h/o torn ligament but states this pain is worse.

## 2020-10-24 NOTE — ED Notes (Signed)
Patient verbalizes understanding of discharge instructions. Opportunity for questioning and answers were provided. Armband removed by staff, pt discharged from ED.  

## 2020-10-24 NOTE — Discharge Instructions (Addendum)
Your xray shows mild arthritic changes. This appears to be muscular spasm. Normally this would be treated with NSAIDS, however you are allergic. You may alternate between heat and ice, ending with ice.  This should resolve in a few days. Follow up with your primary care doctor as soon as possible. SEEK IMMEDIATE MEDICAL ATTENTION IF: New numbness, tingling, weakness, or problem with the use of your arms or legs.  Severe back pain not relieved with medications.  Change in bowel or bladder control.  Increasing pain in any areas of the body (such as chest or abdominal pain).  Shortness of breath, dizziness or fainting.  Nausea (feeling sick to your stomach), vomiting, fever, or sweats.

## 2022-08-13 ENCOUNTER — Emergency Department (HOSPITAL_COMMUNITY)
Admission: EM | Admit: 2022-08-13 | Discharge: 2022-08-13 | Disposition: A | Discharge status: Home / Self Care | Payer: No Typology Code available for payment source | Attending: Emergency Medicine | Admitting: Emergency Medicine

## 2022-08-13 ENCOUNTER — Encounter (HOSPITAL_COMMUNITY): Payer: Self-pay

## 2022-08-13 ENCOUNTER — Other Ambulatory Visit: Payer: Self-pay

## 2022-08-13 DIAGNOSIS — L0211 Cutaneous abscess of neck: Secondary | ICD-10-CM | POA: Diagnosis not present

## 2022-08-13 DIAGNOSIS — L03221 Cellulitis of neck: Secondary | ICD-10-CM | POA: Insufficient documentation

## 2022-08-13 MED ORDER — LIDOCAINE-EPINEPHRINE (PF) 2 %-1:200000 IJ SOLN
10.0000 mL | Freq: Once | INTRAMUSCULAR | Status: AC
  Administered 2022-08-13: 10 mL
  Filled 2022-08-13: qty 20

## 2022-08-13 MED ORDER — DOXYCYCLINE HYCLATE 100 MG PO CAPS: 100.0000 mg | ORAL_CAPSULE | Freq: Two times a day (BID) | ORAL | 0 refills | Status: AC

## 2022-08-13 NOTE — Discharge Instructions (Addendum)
Please take the entire course of antibiotics that prescribed, I would leave your packing in place for 3 days if possible, if it comes out sooner this is okay, I left enough space that it is likely to continue draining even without packing in place.  Please monitor for improvement, if you do not notice significant improvement recommend further evaluation by PCP, or dermatology to discuss antibiotic choice, possible excision as needed

## 2022-08-13 NOTE — ED Triage Notes (Addendum)
Pt has a abscess on the back of his neck that he noticed about 2 weeks ago. Pt states he has tried draining it but it is very painful. Pt states he had 5-500mg  tylenols this morning because he was in so much pain.

## 2022-08-13 NOTE — ED Provider Notes (Signed)
[facility] Provider Note   CSN: [id number] Arrival date & time: 08/13/22  1013     History  Chief Complaint  Patient presents with   Abscess    Kasch Borquez is a 57 y.o. male with noncontributory past medical history who presents with concern for abscess on back of the neck for around the last 2 weeks. Patient reports he tried to drain it on his own but it was overly painful. Patient endorses he took 5-500mg  tylenol this morning because of pain. Denies SI/HI.    Abscess      Home Medications Prior to Admission medications   Medication Sig Start Date End Date Taking? Authorizing Provider  doxycycline (VIBRAMYCIN) 100 MG capsule Take 1 capsule (100 mg total) by mouth 2 (two) times daily. 08/13/22  Yes Quantia Grullon H, PA-C  acetaminophen (TYLENOL) 500 MG tablet Take 1,000 mg by mouth every 6 (six) hours as needed for mild pain, moderate pain, fever or headache.    [provider]  acetaminophen-codeine (TYLENOL #3) 300-30 MG tablet Take 1-2 tablets by mouth every 6 (six) hours as needed for moderate pain. 10/24/20   Margarita Mail, PA-C  meclizine (ANTIVERT) 25 MG tablet Take 1 tablet (25 mg total) by mouth 3 (three) times daily as needed for up to 20 doses for dizziness. 03/28/18   Curatolo, Adam, DO  methocarbamol (ROBAXIN) 500 MG tablet Take 1 tablet (500 mg total) by mouth 3 (three) times daily as needed for muscle spasms. 10/24/20   Margarita Mail, PA-C      Allergies    Aspirin and Ibuprofen    Review of Systems   Review of Systems  Skin:  Positive for wound.  All other systems reviewed and are negative.   Physical Exam Updated Vital Signs BP 132/78 (BP Location: Right Arm)   Pulse 78   Temp 98.4 F (36.9 C) (Oral)   Resp 16   Ht 5\' 11"  (1.803 m)   Wt 92.5 kg   SpO2 100%   BMI 28.45 kg/m  Physical Exam Vitals and nursing note reviewed.  Constitutional:      General: He is not in acute  distress.    Appearance: Normal appearance.  HENT:     Head: Normocephalic and atraumatic.  Eyes:     General:        Right eye: No discharge.        Left eye: No discharge.  Cardiovascular:     Rate and Rhythm: Normal rate and regular rhythm.  Pulmonary:     Effort: Pulmonary effort is normal. No respiratory distress.  Musculoskeletal:        General: No deformity.  Skin:    General: Skin is warm and dry.     Comments: Patient with focal soft tissue swelling, redness, fluctuance at posterior right neck consistent with abscess. Minimal surrounding skin cellulitis.  Postdrainage, expression of small amount of purulence with some blood, still with significant indurated cellulitis noted, all loculations broken, drain in place.  Neurological:     Mental Status: He is alert and oriented to person, place, and time.  Psychiatric:        Mood and Affect: Mood normal.        Behavior: Behavior normal.     ED Results / Procedures / Treatments   Labs (all labs ordered are listed, but only abnormal results are displayed) Labs Reviewed - No data to display  EKG None  Radiology No results  found.  Procedures .Marland KitchenIncision and Drainage  Date/Time: 08/13/2022 12:25 PM  Performed by: Anselmo Pickler, PA-C Authorized by: Anselmo Pickler, PA-C   Consent:    Consent obtained:  Verbal   Consent given by:  Patient   Risks, benefits, and alternatives were discussed: yes     Risks discussed:  Bleeding, incomplete drainage, pain and infection   Alternatives discussed:  No treatment Universal protocol:    Procedure explained and questions answered to patient or proxy's satisfaction: yes     Patient identity confirmed:  Verbally with patient Location:    Type:  Abscess   Size:  3cm   Location:  Neck   Neck location:  R posterior Pre-procedure details:    Skin preparation:  Povidone-iodine Sedation:    Sedation type:  None Anesthesia:    Anesthesia method:  Local  infiltration   Local anesthetic:  Lidocaine 2% WITH epi Procedure type:    Complexity:  Simple Procedure details:    Incision types:  Single straight   Incision depth:  Dermal   Wound management:  Probed and deloculated   Drainage:  Bloody and purulent   Drainage amount:  Scant   Wound treatment:  Wound left open   Packing materials:  1/4 in iodoform gauze   Amount 1/4" iodoform:  2 inches Post-procedure details:    Procedure completion:  Tolerated     Medications Ordered in ED Medications  lidocaine-EPINEPHrine (XYLOCAINE W/EPI) 2 %-1:200000 (PF) injection 10 mL (10 mLs Infiltration Given 08/13/22 1249)    ED Course/ Medical Decision Making/ A&P                             Medical Decision Making Risk Prescription drug management.   This patient is a 57 y.o. male who presents to the ED for concern of abscess on posterior neck.   Differential diagnoses prior to evaluation: Abscess, keratoacanthoma, localized indurated cellulitis, simple irritated cyst, infected hair follicle, versus other  Physical Exam: Physical exam performed. The pertinent findings include: Patient with focal soft tissue swelling, redness, fluctuance at posterior right neck consistent with abscess. Minimal surrounding skin cellulitis.  Postdrainage, expression of small amount of purulence with some blood, still with significant indurated cellulitis noted, all loculations broken, drain in place.  Medications / Treatment: Drained scant amount of purulent, bloody drainage from apparent abscess collection.  Given that there is significant surrounding indurated cellulitis I do think that patient would benefit from being placed on antibiotics for a week.  He is discharged on doxycycline.  Encouraged to follow-up to ensure resolution of infection patient's symptoms consistent with an abscess, drained   Disposition: After consideration of the diagnostic results and the patients response to treatment, I feel  that  as discussed above, placed on antibiotics to cover for surrounding cellulitis, encouraged follow-up, he is stable for discharge at this time. Marland Kitchen   emergency department workup does not suggest an emergent condition requiring admission or immediate intervention beyond what has been performed at this time. The plan is: as above. The patient is safe for discharge and has been instructed to return immediately for worsening symptoms, change in symptoms or any other concerns.  Final Clinical Impression(s) / ED Diagnoses Final diagnoses:  Abscess of neck    Rx / DC Orders ED Discharge Orders          Ordered    doxycycline (VIBRAMYCIN) 100 MG capsule  2 times daily  08/13/22 1221              Zenith Lamphier, Ridott H, PA-C 08/13/22 1332    Carmin Muskrat, MD 08/13/22 1711

## 2022-11-06 ENCOUNTER — Ambulatory Visit (INDEPENDENT_AMBULATORY_CARE_PROVIDER_SITE_OTHER): Payer: No Typology Code available for payment source | Admitting: Dermatology

## 2022-11-06 DIAGNOSIS — L732 Hidradenitis suppurativa: Secondary | ICD-10-CM | POA: Diagnosis not present

## 2022-11-06 MED ORDER — MUPIROCIN 2 % EX OINT: 1.0000 | TOPICAL_OINTMENT | Freq: Two times a day (BID) | CUTANEOUS | 0 refills | Status: AC

## 2022-11-06 MED ORDER — DOXYCYCLINE HYCLATE 100 MG PO TABS: 100.0000 mg | ORAL_TABLET | Freq: Two times a day (BID) | ORAL | 2 refills | Status: AC

## 2022-11-06 NOTE — Progress Notes (Signed)
   New Patient Visit   Subjective  David Bullock is a 57 y.o. male who presents for the following: Rash of bilateral underarms x 2 months. He was referred by his PCP. He was given TMC 0.5% cream bid and he took doxycyline until about 2 weeks ago. It comes up like boils. He had a similar one on his neck that he had lanced in the ER.   He is accompanied by girlfriend.   The following portions of the chart were reviewed this encounter and updated as appropriate: medications, allergies, medical history  Review of Systems:  No other skin or systemic complaints except as noted in HPI or Assessment and Plan.  Objective  Well appearing patient in no apparent distress; mood and affect are within normal limits.      A focused examination was performed of the following areas: Bilateral axilla area     Relevant exam findings are noted in the Assessment and Plan.    Assessment & Plan   HIDRADENITIS SUPPURATIVA Exam:  [place] Stage 2: single or multiple, widely separated recurrent abscesses with sinus tract formation and scarring    Hidradenitis Suppurativa is a chronic; persistent; non-curable, but treatable condition due to abnormal inflamed sweat glands in the body folds (axilla, inframammary, groin, medial thighs), causing recurrent painful draining cysts and scarring. It can be associated with severe scarring acne and cysts; also abscesses and scarring of scalp. The goal is control and prevention of flares, as it is not curable. Scars are permanent and can be thickened. Treatment may include daily use of topical medication and oral antibiotics.  Oral isotretinoin may also be helpful.  For some cases, Humira or Cosentyx (biologic injections) may be prescribed to decrease the inflammatory process and prevent flares.  When indicated, inflamed cysts may also be treated surgically.  Treatment Plan:  Recommend Panoxyl 4% creamy wash daily. Doxycycline 100 mg 1 po bid with food and  plenty of fluid x 5 days as needed for flares Mupirocin ointment bid to flared areas.  Discussed IL injections  Procedure Note Intralesional Injection  Location: bilateral axilla  Informed Consent: Discussed risks (infection, pain, bleeding, bruising, thinning of the skin, loss of skin pigment, lack of resolution, and recurrence of lesion) and benefits of the procedure, as well as the alternatives. Informed consent was obtained. Preparation: The area was prepared a standard fashion.  Anesthesia: none  Procedure Details: An intralesional injection was performed with Kenalog 10 mg/cc. 0.3 cc in total were injected NDC #: 1191-4782-95 Exp: 06/2024   Lot: [id number]  Total number of injections: 6  Plan: The patient was instructed on post-op care. Recommend OTC analgesia as needed for pain.    Return in about 3 months (around 02/05/2023) for Hidradenitis follow up.  I, Joanie Coddington, CMA, am acting as scribe for Langston Reusing, MD .   Documentation: I have reviewed the above documentation for accuracy and completeness, and I agree with the above.  Langston Reusing, MD

## 2022-11-06 NOTE — Patient Instructions (Addendum)
Recommend Panoxyl 4% creamy wash daily.   Hidradenitis Suppurativa is a chronic; persistent; non-curable, but treatable condition due to abnormal inflamed sweat glands in the body folds (axilla, inframammary, groin, medial thighs), causing recurrent painful draining cysts and scarring. It can be associated with severe scarring acne and cysts; also abscesses and scarring of scalp. The goal is control and prevention of flares, as it is not curable. Scars are permanent and can be thickened. Treatment may include daily use of topical medication and oral antibiotics.  Oral isotretinoin may also be helpful.  For some cases, Humira or Cosentyx (biologic injections) may be prescribed to decrease the inflammatory process and prevent flares.  When indicated, inflamed cysts may also be treated surgically.  Doxycycline should be taken with food to prevent nausea. Do not lay down for 30 minutes after taking. Be cautious with sun exposure and use good sun protection while on this medication. Pregnant women should not take this medication.    Due to recent changes in healthcare laws, you may see results of your pathology and/or laboratory studies on MyChart before the doctors have had a chance to review them. We understand that in some cases there may be results that are confusing or concerning to you. Please understand that not all results are received at the same time and often the doctors may need to interpret multiple results in order to provide you with the best plan of care or course of treatment. Therefore, we ask that you please give us 2 [place] days to thoroughly review all your results before contacting the office for clarification. Should we see a critical lab result, you will be contacted sooner.   If You Need Anything After Your Visit  If you have any questions or concerns for your doctor, please call our main line at [phone] If no one answers, please leave a voicemail as directed and we will  return your call as soon as possible. Messages left after 4 pm will be answered the following business day.   You may also send [place] a message via MyChart. We typically respond to MyChart messages within 1-2 business days.  For prescription refills, please ask your pharmacy to contact our office. Our fax number is [phone].  If you have an urgent issue when the clinic is closed that cannot wait until the next business day, you can page your doctor at the number below.    Please note that while we do our best to be available for urgent issues outside of office hours, we are not available 24/7.   If you have an urgent issue and are unable to reach [place], you may choose to seek medical care at your doctor's office, retail clinic, urgent care center, or emergency room.  If you have a medical emergency, please immediately call 911 or go to the emergency department. In the event of inclement weather, please call our main line at [phone] for an update on the status of any delays or closures.  Dermatology Medication Tips: Please keep the boxes that topical medications come in in order to help keep track of the instructions about where and how to use these. Pharmacies typically print the medication instructions only on the boxes and not directly on the medication tubes.   If your medication is too expensive, please contact our office at [phone] or send [place] a message through MyChart.   We are unable to tell what your co-pay for medications will be in advance as this is different depending  on your insurance coverage. However, we may be able to find a substitute medication at lower cost or fill out paperwork to get insurance to cover a needed medication.   If a prior authorization is required to get your medication covered by your insurance company, please allow [place] 1-2 business days to complete this process.  Drug prices often vary depending on where the prescription is filled and some pharmacies  may offer cheaper prices.  The website www.goodrx.com contains coupons for medications through different pharmacies. The prices here do not account for what the cost may be with help from insurance (it may be cheaper with your insurance), but the website can give you the price if you did not use any insurance.  - You can print the associated coupon and take it with your prescription to the pharmacy.  - You may also stop by our office during regular business hours and pick up a GoodRx coupon card.  - If you need your prescription sent electronically to a different pharmacy, notify our office through [facility] or by phone at [phone]

## 2022-11-11 ENCOUNTER — Encounter: Payer: Self-pay | Admitting: Dermatology

## 2022-12-31 IMAGING — CR DG LUMBAR SPINE COMPLETE 4+V
5 series · 5 of 5 positions shown · non-contrast
Comparison: None.

CLINICAL DATA: Back pain

EXAM:
LUMBAR SPINE - COMPLETE 4+ VIEW

[l-spine ap]
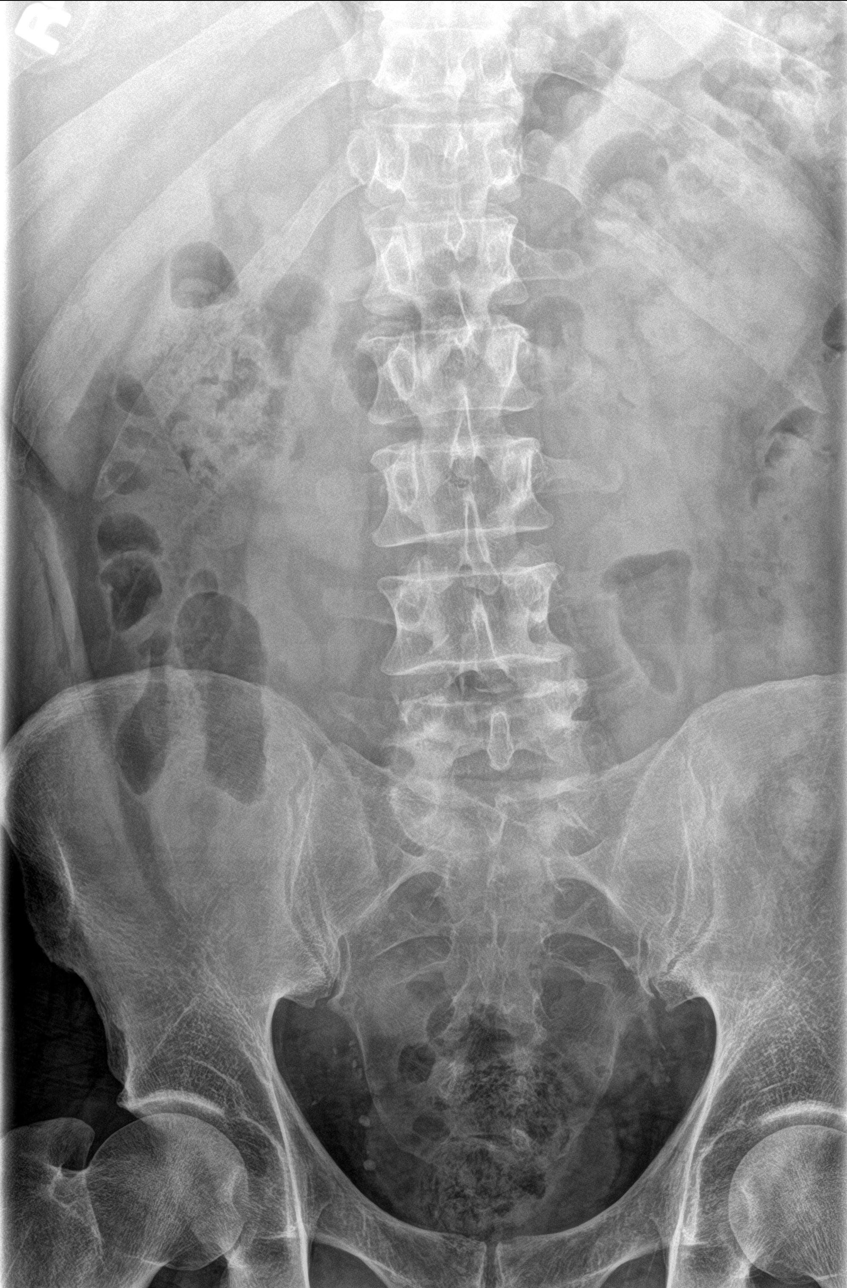

[l-spine obl (1 of 2)]
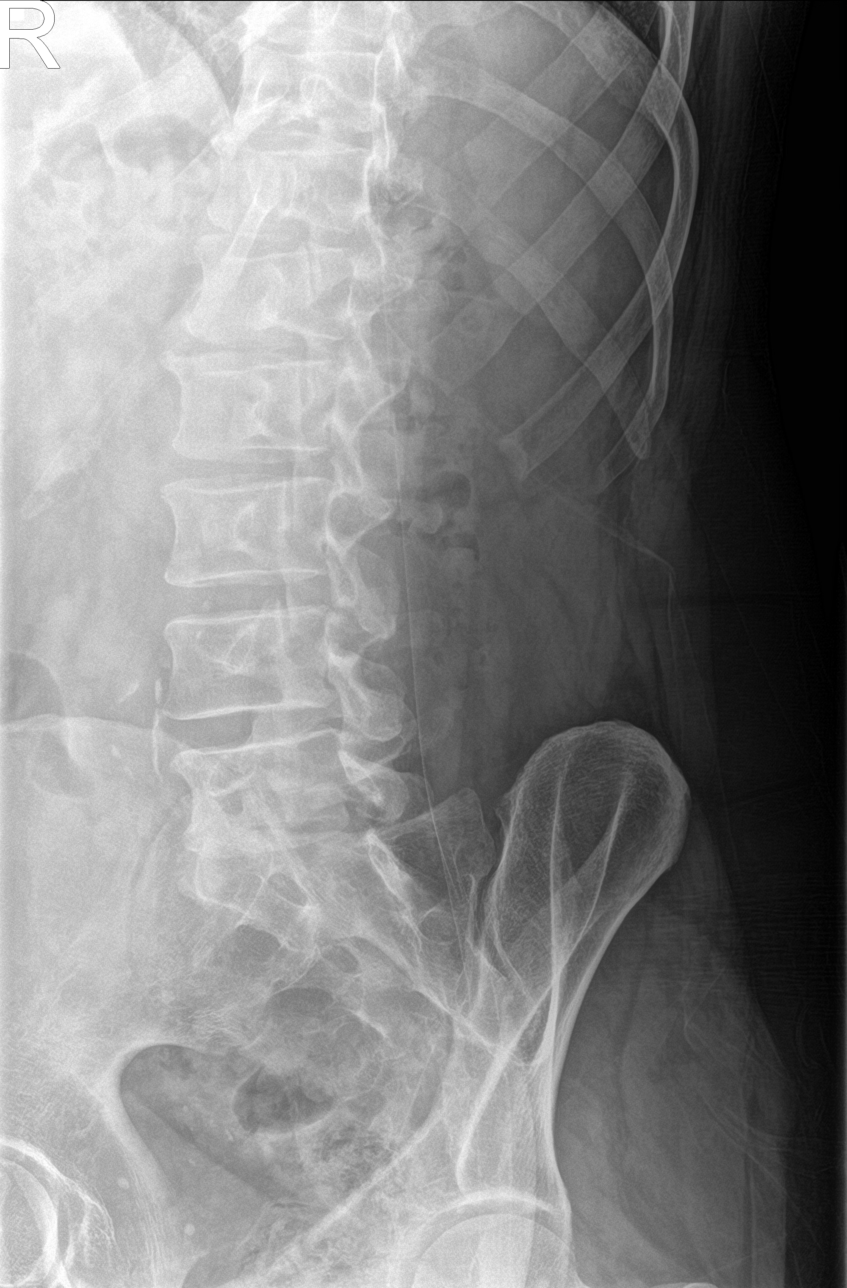

[l-spine obl (2 of 2)]
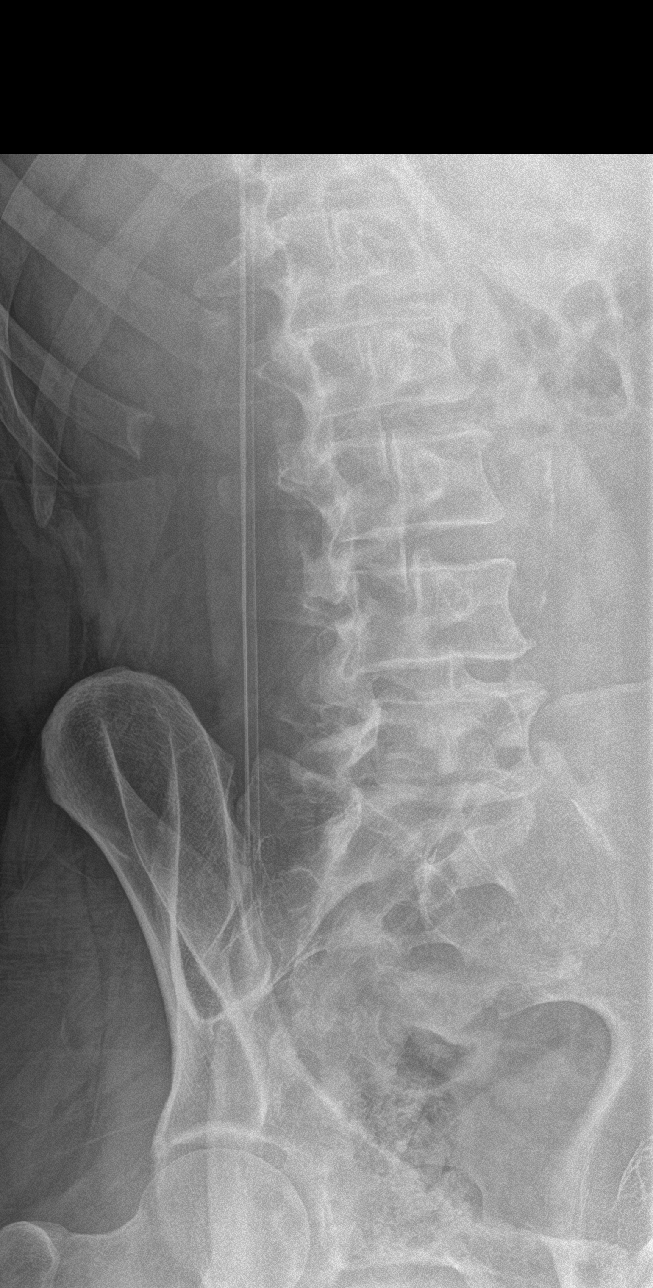

[l-spine lat]
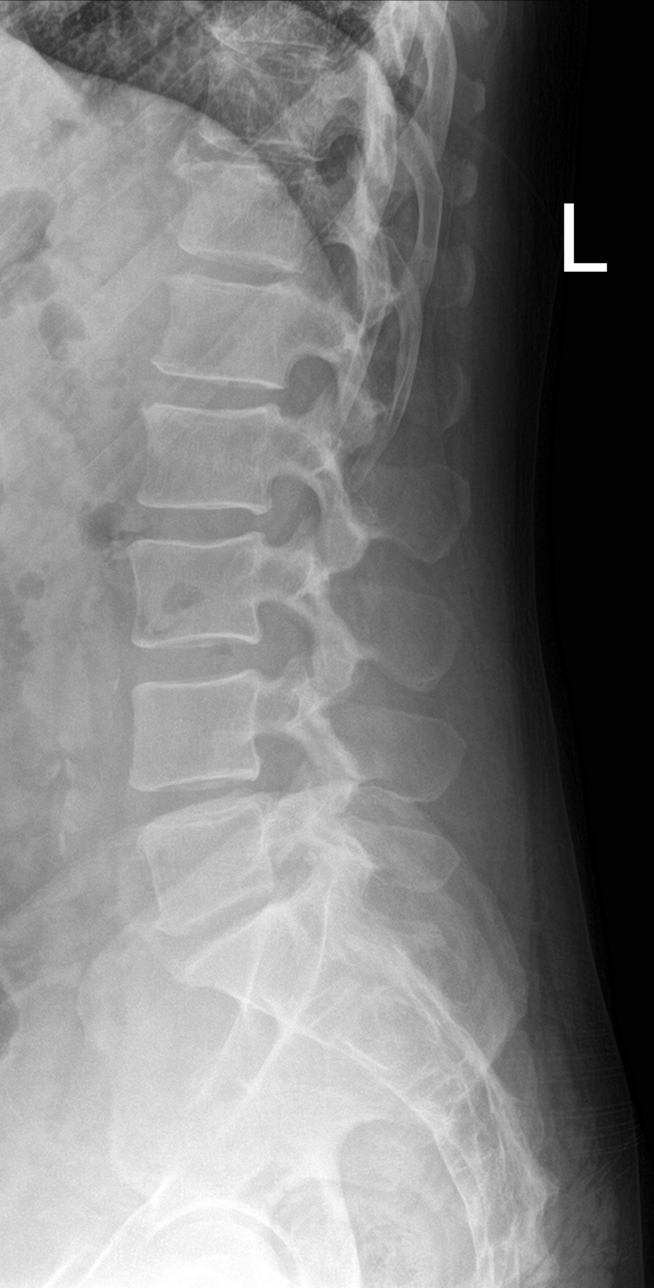

[l-spine spot]
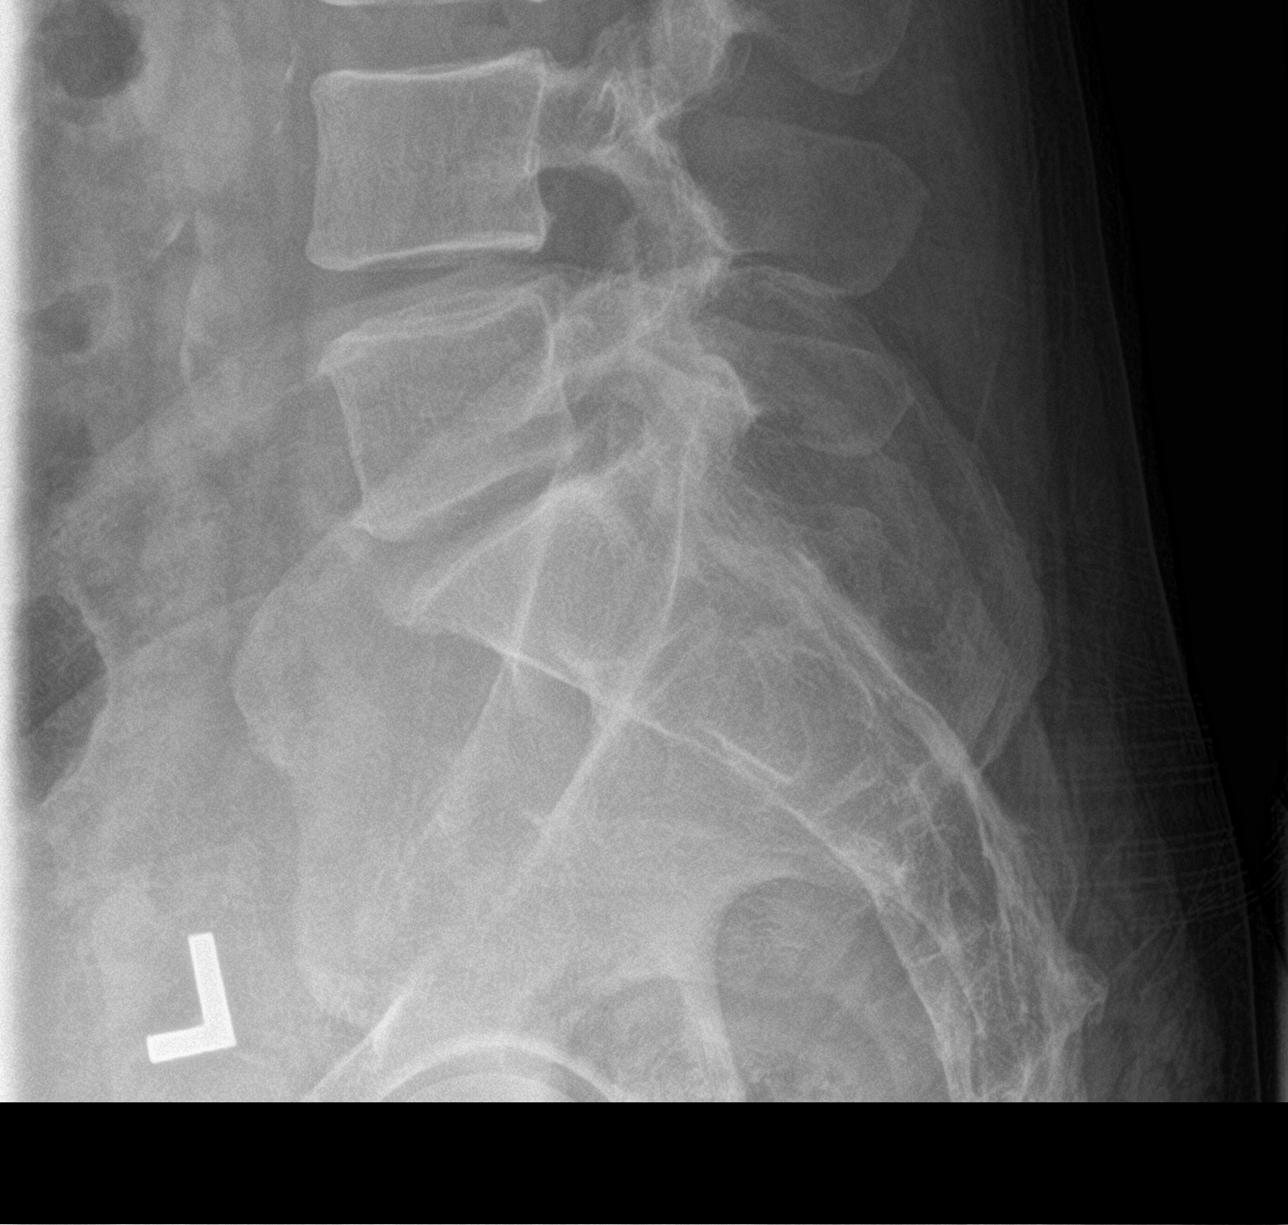

[5 of 5 positions shown; findings below may reference images not displayed]

FINDINGS: Early disc space narrowing at L4-5 and L5-S1. Normal alignment. No
fracture. SI joints symmetric and unremarkable.
IMPRESSION: Early degenerative disc disease in the lower lumbar spine. No acute
bony abnormality.

## 2023-02-05 ENCOUNTER — Ambulatory Visit (INDEPENDENT_AMBULATORY_CARE_PROVIDER_SITE_OTHER): Payer: No Typology Code available for payment source | Admitting: Dermatology

## 2023-02-05 ENCOUNTER — Encounter: Payer: Self-pay | Admitting: Dermatology

## 2023-02-05 VITALS — BP 104/69

## 2023-02-05 DIAGNOSIS — L309 Dermatitis, unspecified: Secondary | ICD-10-CM | POA: Diagnosis not present

## 2023-02-05 DIAGNOSIS — L732 Hidradenitis suppurativa: Secondary | ICD-10-CM | POA: Diagnosis not present

## 2023-02-05 MED ORDER — CLOTRIMAZOLE-BETAMETHASONE 1-0.05 % EX CREA: 1.0000 | TOPICAL_CREAM | Freq: Two times a day (BID) | CUTANEOUS | 0 refills | Status: AC

## 2023-02-05 NOTE — Progress Notes (Signed)
   Follow-Up Visit   Subjective  David Bullock is a 57 y.o. male who presents for the following: Patient here for follow up appointment for Hidradenitis of bilateral axilla. All areas cleared up with IL injection and cream. He has not needed to use doxycycline.  He developed a rash on his left calf about 2 weeks ago that was itchy but is not now. He has been using Mupirocin and TMC and washing with Panoxyl..  Accompanied by girlfriend  The following portions of the chart were reviewed this encounter and updated as appropriate: medications, allergies, medical history  Review of Systems:  No other skin or systemic complaints except as noted in HPI or Assessment and Plan.  Objective  Well appearing patient in no apparent distress; mood and affect are within normal limits.    A focused examination was performed of the following areas: Left lower leg  Relevant exam findings are noted in the Assessment and Plan.    Assessment & Plan   HIDRADENITIS SUPPURATIVA Exam: Clear today per patient  Well controlled   Hidradenitis Suppurativa is a chronic; persistent; non-curable, but treatable condition due to abnormal inflamed sweat glands in the body folds (axilla, inframammary, groin, medial thighs), causing recurrent painful draining cysts and scarring. It can be associated with severe scarring acne and cysts; also abscesses and scarring of scalp. The goal is control and prevention of flares, as it is not curable. Scars are permanent and can be thickened. Treatment may include daily use of topical medication and oral antibiotics.  Oral isotretinoin may also be helpful.  For some cases, Humira or Cosentyx (biologic injections) may be prescribed to decrease the inflammatory process and prevent flares.  When indicated, inflamed cysts may also be treated surgically.  Treatment Plan: Start Doxycycline 100 mg 1 po bid x 5 days prn flares. Continue Panoxyl wash daily.  If doxycycline does not  resolve flare, RTC for IL injections.   Dermatitis - Tinea Corporus vs Contact Dermatitis Exam: Multiple large erythematous plaques with fine scale with surrounding erythematous papules left posterior calf  Treatment Plan: Clotrimazole/Betamethasone cream bid x 2 weeks. Advised patient to send a MyChart message if rash is not improved after 2 weeks.     Return in about 6 months (around 08/08/2023) for Follow up.  I, Joanie Coddington, CMA, am acting as scribe for [employer], DO .   Documentation: I have reviewed the above documentation for accuracy and completeness, and I agree with the above.  Langston Reusing, DO

## 2023-02-05 NOTE — Patient Instructions (Signed)
Hello [place],  Thank you for visiting the clinic today. I appreciate your dedication to managing your health and am pleased to hear about the stability of your HS condition. Below are the key instructions from today's consultation:  - Rash Treatment (possible tinea corporis vs contact dermatitis):   - Apply a prescribed cream containing clotrimazole and betamethasone to the affected area on your left leg twice daily for two weeks. You may stop earlier if the rash clears up.   - If the rash persists or worsens, please contact the clinic or send a MyChart message.  - HS Management:   - Continue using benzoyl peroxide as previously advised.   - For any HS flares, take doxycycline twice a day with food. If doxycycline is ineffective, please come in for injections.   - Follow-up appointment for HS is scheduled for six months unless you experience any flares.   Your prescription has been sent to your pharmacy. Please ensure to follow up if there are any issues with your treatment or if you have any further questions.  -Dr. Onalee Hua      Due to recent changes in healthcare laws, you may see results of your pathology and/or laboratory studies on MyChart before the doctors have had a chance to review them. We understand that in some cases there may be results that are confusing or concerning to you. Please understand that not all results are received at the same time and often the doctors may need to interpret multiple results in order to provide you with the best plan of care or course of treatment. Therefore, we ask that you please give [place] 2 business days to thoroughly review all your results before contacting the office for clarification. Should we see a critical lab result, you will be contacted sooner.   If You Need Anything After Your Visit  If you have any questions or concerns for your doctor, please call our main line at [phone] If no one answers, please leave a voicemail as directed  and we will return your call as soon as possible. Messages left after 4 pm will be answered the following business day.   You may also send [place] a message via MyChart. We typically respond to MyChart messages within 1-2 business days.  For prescription refills, please ask your pharmacy to contact our office. Our fax number is [phone].  If you have an urgent issue when the clinic is closed that cannot wait until the next business day, you can page your doctor at the number below.    Please note that while we do our best to be available for urgent issues outside of office hours, we are not available 24/7.   If you have an urgent issue and are unable to reach [place], you may choose to seek medical care at your doctor's office, retail clinic, urgent care center, or emergency room.  If you have a medical emergency, please immediately call 911 or go to the emergency department. In the event of inclement weather, please call our main line at [phone] for an update on the status of any delays or closures.  Dermatology Medication Tips: Please keep the boxes that topical medications come in in order to help keep track of the instructions about where and how to use these. Pharmacies typically print the medication instructions only on the boxes and not directly on the medication tubes.   If your medication is too expensive, please contact our office at [phone] or send [place] a  message through MyChart.   We are unable to tell what your co-pay for medications will be in advance as this is different depending on your insurance coverage. However, we may be able to find a substitute medication at lower cost or fill out paperwork to get insurance to cover a needed medication.   If a prior authorization is required to get your medication covered by your insurance company, please allow [place] 1-2 business days to complete this process.  Drug prices often vary depending on where the prescription is filled and  some pharmacies may offer cheaper prices.  The website www.goodrx.com contains coupons for medications through different pharmacies. The prices here do not account for what the cost may be with help from insurance (it may be cheaper with your insurance), but the website can give you the price if you did not use any insurance.  - You can print the associated coupon and take it with your prescription to the pharmacy.  - You may also stop by our office during regular business hours and pick up a GoodRx coupon card.  - If you need your prescription sent electronically to a different pharmacy, notify our office through [facility] or by phone at [phone]

## 2023-08-06 ENCOUNTER — Ambulatory Visit: Payer: No Typology Code available for payment source | Admitting: Dermatology
